# Patient Record
Sex: Female | Born: 1965 | Race: White | Hispanic: No | Marital: Married | Smoking: Never smoker
Health system: Southern US, Community
[De-identification: ages and names within clinical notes are randomized; demographics above are authoritative.]

## PROBLEM LIST (undated history)

## (undated) DIAGNOSIS — E079 Disorder of thyroid, unspecified: Secondary | ICD-10-CM

## (undated) HISTORY — PX: ABDOMINAL HYSTERECTOMY: SHX81

---

## 2003-03-02 ENCOUNTER — Encounter: Payer: Self-pay | Admitting: Internal Medicine

## 2003-03-02 ENCOUNTER — Ambulatory Visit (HOSPITAL_COMMUNITY): Admission: RE | Admit: 2003-03-02 | Discharge: 2003-03-02 | Payer: Self-pay | Admitting: Internal Medicine

## 2006-05-14 ENCOUNTER — Ambulatory Visit (HOSPITAL_COMMUNITY): Admission: RE | Admit: 2006-05-14 | Discharge: 2006-05-14 | Payer: Self-pay | Admitting: Internal Medicine

## 2007-01-22 ENCOUNTER — Other Ambulatory Visit: Admission: RE | Admit: 2007-01-22 | Discharge: 2007-01-22 | Payer: Self-pay | Admitting: *Deleted

## 2007-05-23 ENCOUNTER — Ambulatory Visit (HOSPITAL_COMMUNITY): Admission: RE | Admit: 2007-05-23 | Discharge: 2007-05-23 | Payer: Self-pay | Admitting: Internal Medicine

## 2008-05-12 ENCOUNTER — Ambulatory Visit (HOSPITAL_COMMUNITY): Admission: RE | Admit: 2008-05-12 | Discharge: 2008-05-12 | Payer: Self-pay | Admitting: Internal Medicine

## 2008-06-10 ENCOUNTER — Ambulatory Visit (HOSPITAL_COMMUNITY): Admission: RE | Admit: 2008-06-10 | Discharge: 2008-06-10 | Payer: Self-pay | Admitting: Internal Medicine

## 2009-08-31 ENCOUNTER — Ambulatory Visit (HOSPITAL_COMMUNITY): Admission: RE | Admit: 2009-08-31 | Discharge: 2009-08-31 | Payer: Self-pay | Admitting: Internal Medicine

## 2018-03-31 ENCOUNTER — Encounter (HOSPITAL_COMMUNITY): Payer: Self-pay

## 2018-03-31 ENCOUNTER — Ambulatory Visit (HOSPITAL_COMMUNITY)
Admission: EM | Admit: 2018-03-31 | Discharge: 2018-03-31 | Disposition: A | Discharge status: Home / Self Care | Payer: PRIVATE HEALTH INSURANCE

## 2018-03-31 ENCOUNTER — Other Ambulatory Visit: Payer: Self-pay

## 2018-03-31 DIAGNOSIS — R509 Fever, unspecified: Secondary | ICD-10-CM

## 2018-03-31 DIAGNOSIS — R52 Pain, unspecified: Secondary | ICD-10-CM

## 2018-03-31 DIAGNOSIS — R6883 Chills (without fever): Secondary | ICD-10-CM

## 2018-03-31 DIAGNOSIS — R6889 Other general symptoms and signs: Secondary | ICD-10-CM

## 2018-03-31 HISTORY — DX: Disorder of thyroid, unspecified: E07.9

## 2018-03-31 MED ORDER — ACETAMINOPHEN 325 MG PO TABS
650.0000 mg | ORAL_TABLET | Freq: Once | ORAL | Status: AC
  Administered 2018-03-31: 650 mg via ORAL

## 2018-03-31 MED ORDER — ACETAMINOPHEN 325 MG PO TABS
ORAL_TABLET | ORAL | Status: AC
  Filled 2018-03-31: qty 2

## 2018-03-31 MED ORDER — ONDANSETRON 4 MG PO TBDP
ORAL_TABLET | ORAL | Status: AC
  Filled 2018-03-31: qty 2

## 2018-03-31 MED ORDER — OSELTAMIVIR PHOSPHATE 75 MG PO CAPS: 75.0000 mg | ORAL_CAPSULE | Freq: Two times a day (BID) | ORAL | 0 refills | Status: AC

## 2018-03-31 MED ORDER — ONDANSETRON 8 MG PO TBDP: 8.0000 mg | ORAL_TABLET | Freq: Three times a day (TID) | ORAL | 0 refills | Status: AC | PRN

## 2018-03-31 MED ORDER — ONDANSETRON 4 MG PO TBDP
8.0000 mg | ORAL_TABLET | Freq: Once | ORAL | Status: AC
  Administered 2018-03-31: 8 mg via ORAL

## 2018-03-31 NOTE — Discharge Instructions (Addendum)
You may take 500mg Tylenol with ibuprofen 600mg every 6 hours for pain and inflammation. ° °

## 2018-03-31 NOTE — ED Triage Notes (Signed)
Pt states she has chills and body aches. This started today.

## 2018-03-31 NOTE — ED Provider Notes (Signed)
  MRN: [id number] DOB: Jan 11, 1966  Subjective:   Christina Cooper is a 52 y.o. female presenting for acute onset of fever, body aches, chills, decreased appetite, headache, ear fullness, nausea without vomiting. Had a dry cough over night. Denies fever, abdominal pain, chest pain, sinus pain, ear pain, throat pain, rashes. Has been taking 800mg  ibuprofen. Has not received her flu shot.  reports that she has never smoked. She has never used smokeless tobacco.     No current facility-administered medications for this encounter.   Current Outpatient Medications:  .  fluticasone (FLONASE) 50 MCG/ACT nasal spray, Place into both nostrils daily., Disp: , Rfl:  .  levothyroxine (SYNTHROID, LEVOTHROID) 100 MCG tablet, Take 100 mcg by mouth daily before breakfast., Disp: , Rfl:     Is also taking fish oil, vitamin C.    No Known Allergies   Past Medical History:  Diagnosis Date  . Thyroid disease      Past Surgical History:  Procedure Laterality Date  . ABDOMINAL HYSTERECTOMY      Objective:   Vitals: BP (!) 147/82 (BP Location: Right Arm)   Temp 100 F (37.8 C) (Oral)   Resp 16   Ht 5\' 6"  (1.676 m)   Wt 210 lb (95.3 kg)   SpO2 99%   BMI 33.89 kg/m   Physical Exam  Constitutional: She is oriented to person, place, and time. She appears well-developed and well-nourished.  HENT:  Right Ear: Tympanic membrane and external ear normal.  Left Ear: Tympanic membrane and external ear normal.  Nose: Mucosal edema and rhinorrhea present.  Mouth/Throat: Oropharynx is clear and moist. No oropharyngeal exudate, posterior oropharyngeal edema, posterior oropharyngeal erythema or tonsillar abscesses.  Eyes: Right eye exhibits no discharge. Left eye exhibits no discharge.  Cardiovascular: Normal rate, regular rhythm, normal heart sounds and intact distal pulses. Exam reveals no gallop and no friction rub.  No murmur heard. Pulmonary/Chest: Effort normal and breath sounds normal. No stridor.  No respiratory distress. She has no wheezes. She has no rales.  Neurological: She is alert and oriented to person, place, and time.  Skin: Skin is warm and dry.  Psychiatric: She has a normal mood and affect.    Assessment and Plan :   Flu-like symptoms  Body aches  Fever, unspecified  Chills  We will manage with supportive care.  Provided patient with a prescription for Tamiflu in case she wanted to start this to treat empirically for influenza. Return-to-clinic precautions discussed, patient verbalized understanding.      Wallis Bamberg, PA-C 03/31/18 1529

## 2020-11-01 ENCOUNTER — Other Ambulatory Visit (HOSPITAL_COMMUNITY): Payer: Self-pay | Admitting: *Deleted

## 2020-11-02 ENCOUNTER — Ambulatory Visit (HOSPITAL_COMMUNITY)
Admission: RE | Admit: 2020-11-02 | Discharge: 2020-11-02 | Disposition: A | Discharge status: Home / Self Care | Payer: Self-pay | Source: Ambulatory Visit | Attending: Cardiology | Admitting: Cardiology

## 2022-01-01 IMAGING — CT CT CARDIAC CORONARY ARTERY CALCIUM SCORE
2 series · 15 of 20 positions shown, 17 images · non-contrast
Comparison: None.
COMPARISON: None.

Addendum:
EXAM:
OVER-READ INTERPRETATION  CT CHEST

The following report is an over-read performed by radiologist Dr.
over-read does not include interpretation of cardiac or coronary
anatomy or pathology. The coronary calcium score interpretation by
the cardiologist is attached.
CLINICAL DATA: Risk stratification: 54 Year-old Caucasian female
Coronary Calcium Score
TECHNIQUE: The patient was scanned on a Siemens Force scanner. Axial
non-contrast 3 mm slices were carried out through the heart. The
data set was analyzed on a dedicated work station and scored using
the Agatson method.

[Series 2: ct hrt calcium 74 % · axial · 0.57mm/px · z∈[+1448,+1558]mm · 8 of 49 slices shown, 10 images]
[im 6/49  vessel]
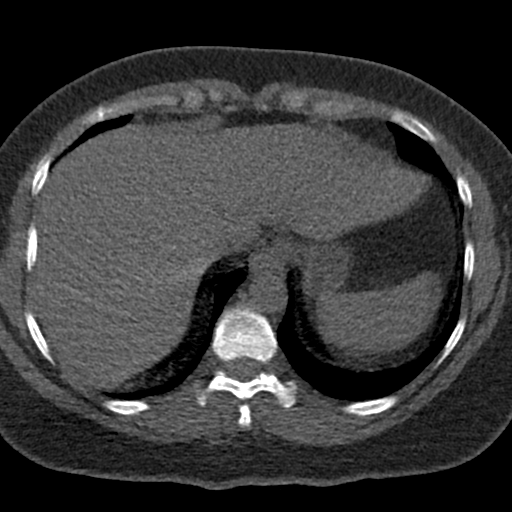
[im 6/49  lung]
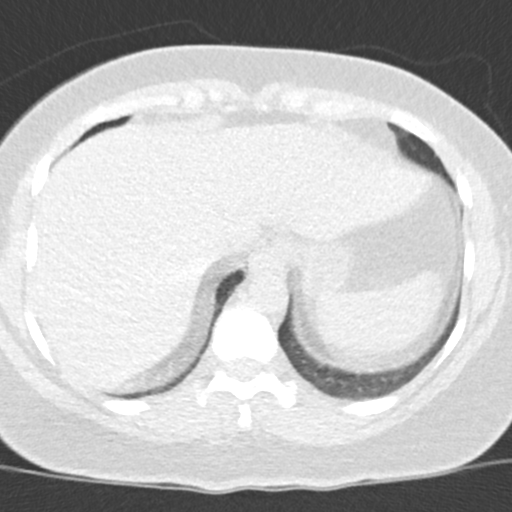
[im 11/49  vessel]
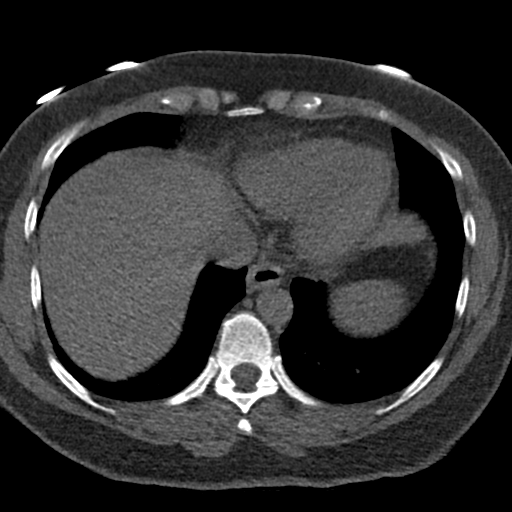
[im 17/49  vessel]
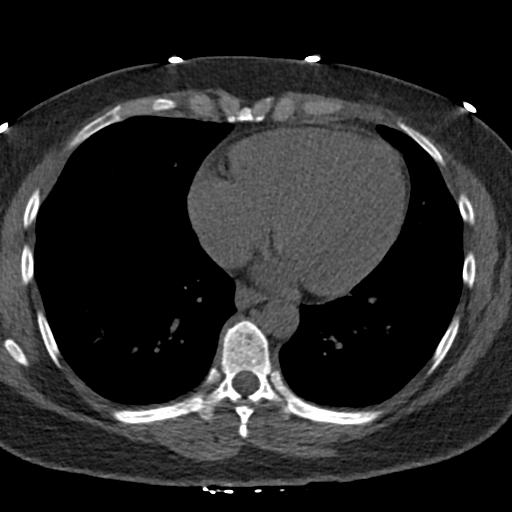
[im 22/49  vessel]
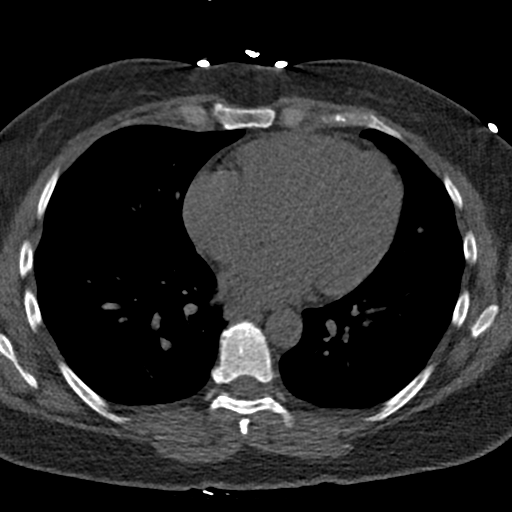
[im 27/49  vessel]
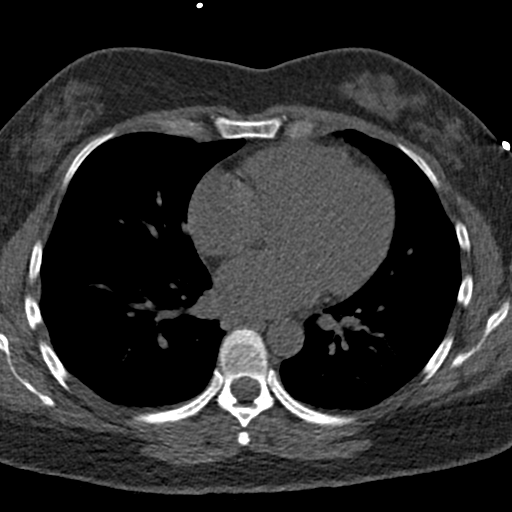
[im 27/49  lung]
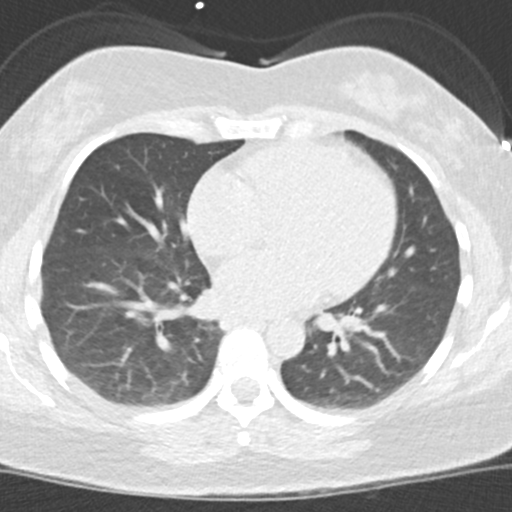
[im 33/49  vessel]
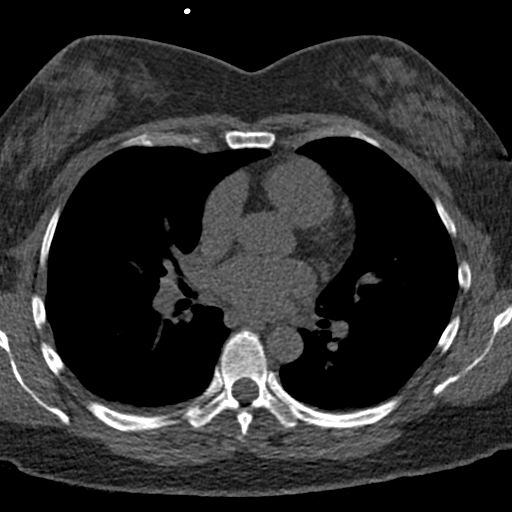
[im 38/49  vessel]
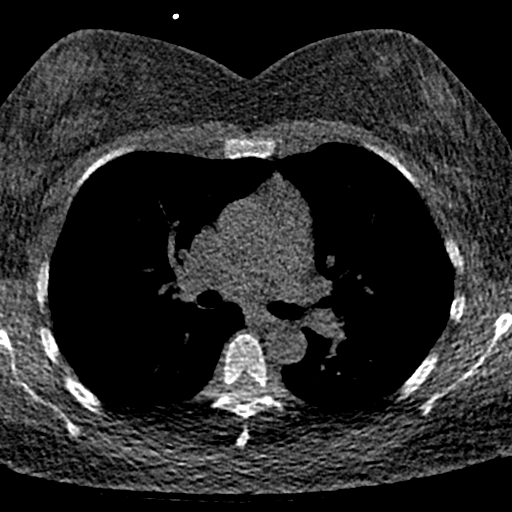
[im 43/49  vessel]
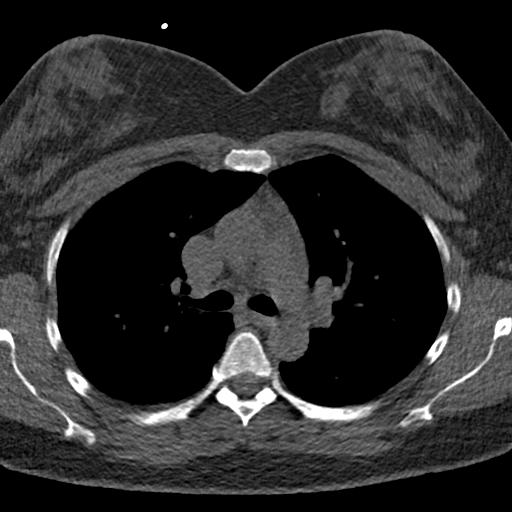

[Series 3: soft full fov 74 % · axial · 0.57mm/px · z∈[+1448,+1544]mm · 7 of 49 slices shown]
[im 6/49  vessel]
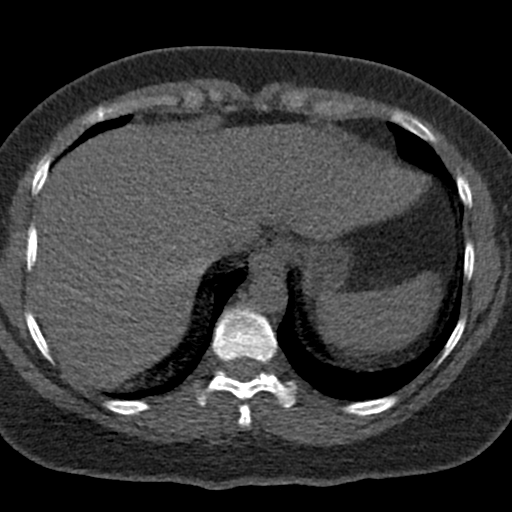
[im 11/49  vessel]
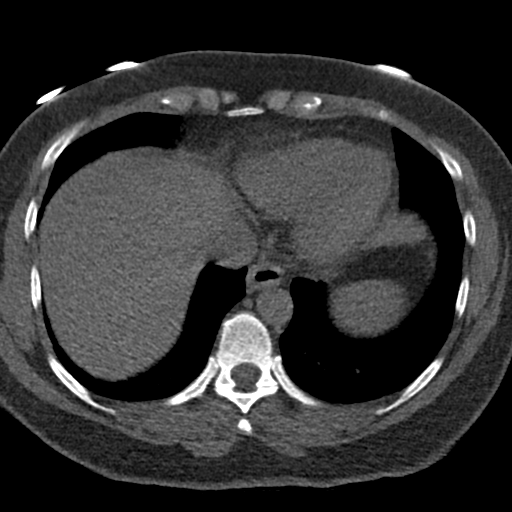
[im 17/49  vessel]
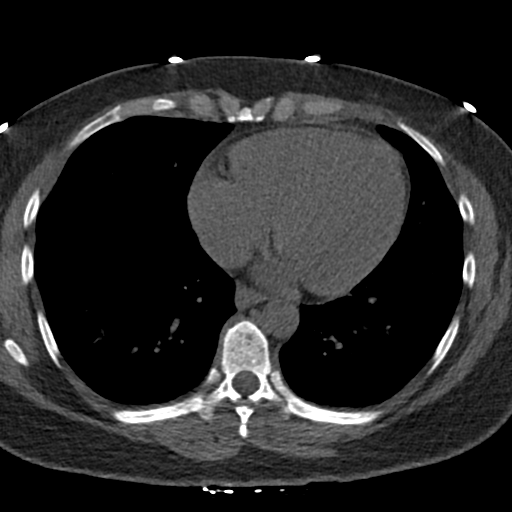
[im 22/49  vessel]
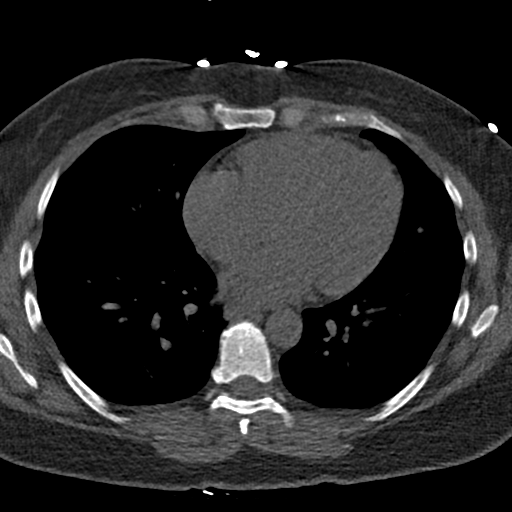
[im 27/49  vessel]
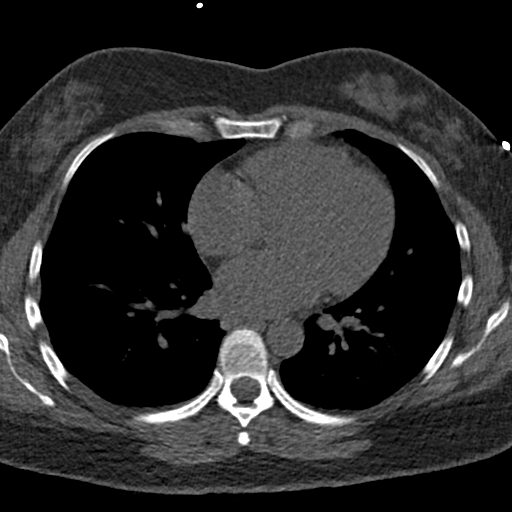
[im 33/49  vessel]
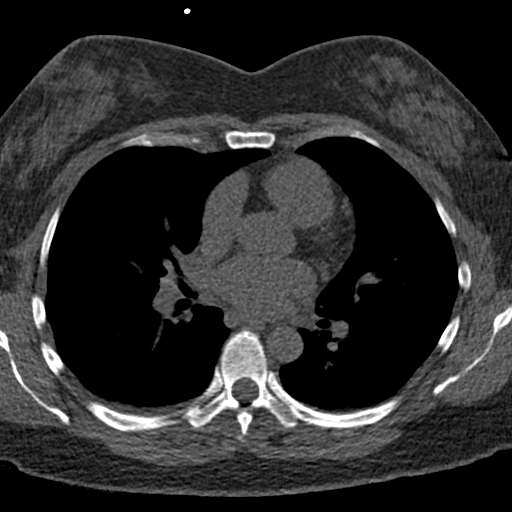
[im 38/49  vessel]
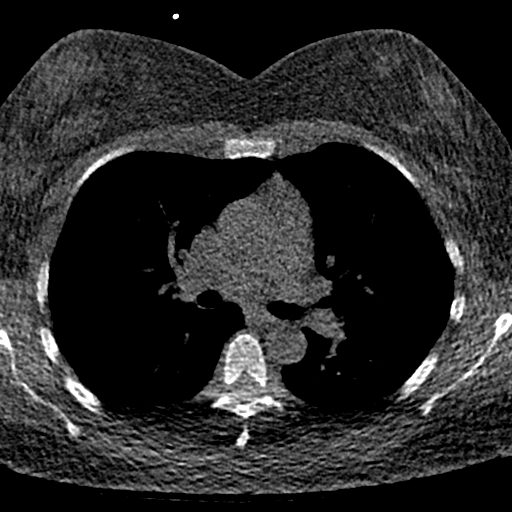

[15 of 20 positions shown; findings below may reference images not displayed]

FINDINGS: Vascular: No significant noncardiac vascular findings.

Mediastinum/Nodes: Visualized mediastinum and hilar regions
demonstrate no lymphadenopathy or masses.

Lungs/Pleura: Visualized lungs show no evidence of pulmonary edema,
consolidation, pneumothorax, nodule or pleural fluid.

Upper Abdomen: No acute abnormality.

Musculoskeletal: No chest wall mass or suspicious bone lesions
identified.
IMPRESSION: No significant incidental findings.
FINDINGS: Non-cardiac: See separate report from [REDACTED].

Ascending Aorta: Normal caliber.

Pericardium: Normal.

Coronary arteries: Normal origins.

Coronary Calcium Score:

Left main: 0

Left anterior descending artery: 0

Left circumflex artery: 0

Right coronary artery: 0

Total: 0

Percentile: 1st for age, sex, and race matched control.

Motion artifact noted.
IMPRESSION: 1. Coronary calcium score of 0. This was 1st percentile for age,
gender, and race matched controls.

RECOMMENDATIONS:



If CAC = 0, it is reasonable to withhold statin therapy and reassess
in 5 to 10 years, as long as higher risk conditions are absent
(diabetes mellitus, family history of premature CHD in first degree
relatives (males <55 years; females <65 years), cigarette smoking,
LDL >=190 mg/dL or other independent risk factors).

If CAC is 1 to 99, it is reasonable to initiate statin therapy for
patients ?55 years of age.

If CAC is >=100 or >=75th percentile, it is reasonable to initiate
statin therapy at any age.

Cardiology referral should be considered for patients with CAC
scores =400 or >=75th percentile.

*6103 AHA/ACC/AACVPR/AAPA/ABC/DILAMAR/KLDOON/LING/Wilber Andres/BLAIN/HEII/IM
Guideline on the Management of Blood Cholesterol: A Report of the
American College of Cardiology/American Heart Association Task Force
on Clinical Practice Guidelines. J Am Coll Cardiol.
0360;73(24):1677-1844.

*** End of Addendum ***
EXAM:
OVER-READ INTERPRETATION  CT CHEST

The following report is an over-read performed by radiologist Dr.
over-read does not include interpretation of cardiac or coronary
anatomy or pathology. The coronary calcium score interpretation by
the cardiologist is attached.
FINDINGS: Vascular: No significant noncardiac vascular findings.

Mediastinum/Nodes: Visualized mediastinum and hilar regions
demonstrate no lymphadenopathy or masses.

Lungs/Pleura: Visualized lungs show no evidence of pulmonary edema,
consolidation, pneumothorax, nodule or pleural fluid.

Upper Abdomen: No acute abnormality.

Musculoskeletal: No chest wall mass or suspicious bone lesions
identified.
IMPRESSION: No significant incidental findings.

## 2023-01-22 ENCOUNTER — Other Ambulatory Visit (HOSPITAL_COMMUNITY)
Admission: RE | Admit: 2023-01-22 | Discharge: 2023-01-22 | Disposition: A | Discharge status: Home / Self Care | Payer: Self-pay | Attending: Oncology | Admitting: Oncology

## 2023-01-22 ENCOUNTER — Other Ambulatory Visit: Payer: Self-pay | Admitting: Oncology

## 2023-01-22 DIAGNOSIS — Z006 Encounter for examination for normal comparison and control in clinical research program: Secondary | ICD-10-CM

## 2023-01-23 ENCOUNTER — Other Ambulatory Visit: Payer: Self-pay | Admitting: Oncology

## 2023-01-23 ENCOUNTER — Other Ambulatory Visit (HOSPITAL_COMMUNITY)
Admission: RE | Admit: 2023-01-23 | Discharge: 2023-01-23 | Disposition: A | Discharge status: Home / Self Care | Payer: Self-pay | Source: Other Acute Inpatient Hospital | Attending: Oncology | Admitting: Oncology

## 2023-01-23 DIAGNOSIS — Z0000 Encounter for general adult medical examination without abnormal findings: Secondary | ICD-10-CM | POA: Insufficient documentation

## 2023-02-26 LAB — HELIX MOLECULAR SCREEN: Genetic Analysis Overall Interpretation: NEGATIVE
